# Patient Record
Sex: Female | Born: 1965 | Race: White | Hispanic: No | Marital: Married | Smoking: Never smoker
Health system: Southern US, Community
[De-identification: ages and names within clinical notes are randomized; demographics above are authoritative.]

---

## 2011-07-08 HISTORY — PX: MYOMECTOMY: SHX85

## 2018-09-28 ENCOUNTER — Other Ambulatory Visit: Payer: Self-pay | Admitting: Physician Assistant

## 2018-09-28 DIAGNOSIS — Z1231 Encounter for screening mammogram for malignant neoplasm of breast: Secondary | ICD-10-CM

## 2019-01-05 ENCOUNTER — Other Ambulatory Visit: Payer: Self-pay

## 2019-01-05 ENCOUNTER — Ambulatory Visit
Admission: RE | Admit: 2019-01-05 | Discharge: 2019-01-05 | Disposition: A | Discharge status: Home / Self Care | Payer: Managed Care, Other (non HMO) | Source: Ambulatory Visit | Attending: Physician Assistant | Admitting: Physician Assistant

## 2019-01-05 DIAGNOSIS — Z1231 Encounter for screening mammogram for malignant neoplasm of breast: Secondary | ICD-10-CM | POA: Diagnosis not present

## 2019-05-30 ENCOUNTER — Other Ambulatory Visit: Payer: Self-pay | Admitting: Sports Medicine

## 2019-05-30 DIAGNOSIS — M75122 Complete rotator cuff tear or rupture of left shoulder, not specified as traumatic: Secondary | ICD-10-CM

## 2019-05-30 DIAGNOSIS — G8929 Other chronic pain: Secondary | ICD-10-CM

## 2019-05-30 DIAGNOSIS — M75102 Unspecified rotator cuff tear or rupture of left shoulder, not specified as traumatic: Secondary | ICD-10-CM

## 2019-05-31 ENCOUNTER — Other Ambulatory Visit (HOSPITAL_COMMUNITY): Payer: Self-pay | Admitting: Sports Medicine

## 2019-05-31 DIAGNOSIS — M75102 Unspecified rotator cuff tear or rupture of left shoulder, not specified as traumatic: Secondary | ICD-10-CM

## 2019-05-31 DIAGNOSIS — G8929 Other chronic pain: Secondary | ICD-10-CM

## 2019-05-31 DIAGNOSIS — M25512 Pain in left shoulder: Secondary | ICD-10-CM

## 2019-05-31 DIAGNOSIS — M75122 Complete rotator cuff tear or rupture of left shoulder, not specified as traumatic: Secondary | ICD-10-CM

## 2019-06-24 ENCOUNTER — Other Ambulatory Visit
Admission: RE | Admit: 2019-06-24 | Discharge: 2019-06-24 | Disposition: A | Discharge status: Home / Self Care | Payer: Commercial Managed Care - PPO | Source: Ambulatory Visit | Attending: Sports Medicine | Admitting: Sports Medicine

## 2019-06-24 DIAGNOSIS — Z01812 Encounter for preprocedural laboratory examination: Secondary | ICD-10-CM | POA: Insufficient documentation

## 2019-06-24 DIAGNOSIS — U071 COVID-19: Secondary | ICD-10-CM | POA: Insufficient documentation

## 2019-06-25 ENCOUNTER — Ambulatory Visit: Admission: RE | Admit: 2019-06-25 | Payer: Commercial Managed Care - PPO | Source: Home / Self Care

## 2019-06-25 ENCOUNTER — Encounter: Admission: RE | Payer: Self-pay | Source: Home / Self Care

## 2019-06-25 LAB — SARS CORONAVIRUS 2 (TAT 6-24 HRS): SARS Coronavirus 2: POSITIVE — AB

## 2019-06-25 SURGERY — MRI WITH ANESTHESIA
Anesthesia: General | Laterality: Left

## 2019-06-25 NOTE — Progress Notes (Signed)
Planning to call patient to provide MRI instructions. Noted positive COVID result. Dr. Rudene Christians (on call provider for ordering practice) notified. Verbal order to cancel MRI given. I advised him that on reschedule that H&P would be out of date. MRI/ OR notified.

## 2019-06-28 ENCOUNTER — Ambulatory Visit (HOSPITAL_COMMUNITY): Payer: Commercial Managed Care - PPO

## 2019-06-28 ENCOUNTER — Encounter (HOSPITAL_COMMUNITY): Payer: Self-pay

## 2019-08-05 ENCOUNTER — Ambulatory Visit (HOSPITAL_COMMUNITY): Payer: Commercial Managed Care - PPO

## 2019-08-05 ENCOUNTER — Ambulatory Visit: Payer: Commercial Managed Care - PPO

## 2019-08-09 ENCOUNTER — Ambulatory Visit (HOSPITAL_COMMUNITY): Payer: Commercial Managed Care - PPO

## 2019-09-08 ENCOUNTER — Other Ambulatory Visit: Payer: Self-pay | Admitting: Obstetrics and Gynecology

## 2019-09-08 DIAGNOSIS — Z1231 Encounter for screening mammogram for malignant neoplasm of breast: Secondary | ICD-10-CM

## 2019-10-14 ENCOUNTER — Other Ambulatory Visit: Admission: RE | Admit: 2019-10-14 | Payer: Commercial Managed Care - PPO | Source: Ambulatory Visit

## 2019-10-14 ENCOUNTER — Other Ambulatory Visit
Admission: RE | Admit: 2019-10-14 | Discharge: 2019-10-14 | Disposition: A | Discharge status: Home / Self Care | Payer: Commercial Managed Care - PPO | Source: Ambulatory Visit | Attending: Sports Medicine | Admitting: Sports Medicine

## 2019-10-14 DIAGNOSIS — Z20822 Contact with and (suspected) exposure to covid-19: Secondary | ICD-10-CM | POA: Insufficient documentation

## 2019-10-14 DIAGNOSIS — Z01812 Encounter for preprocedural laboratory examination: Secondary | ICD-10-CM | POA: Diagnosis not present

## 2019-10-14 LAB — SARS CORONAVIRUS 2 (TAT 6-24 HRS): SARS Coronavirus 2: NEGATIVE

## 2019-10-17 ENCOUNTER — Other Ambulatory Visit: Payer: Self-pay

## 2019-10-17 ENCOUNTER — Encounter (HOSPITAL_COMMUNITY): Payer: Self-pay | Admitting: *Deleted

## 2019-10-17 NOTE — Progress Notes (Addendum)
Mrs Milani denies chest pain or shortness of breath. Patient tested negative for Covid 10/14/19- she has been at a home until today- she went to a couple of stores.  Patient reports that she wore a mask and distanced herself.Patient said that she didn't know that she was supposed to be in quarantine.  Mrs Writer reports that "she was Covid postive the end of December she thought it would be ok."  I informed Mrs Jasko that I will speak with the an anesthesiologist and if there are any hanges she would hear from the physician who ordered the MRI. I informed Dr Joanna Hews that patient wen to 2 stores with mask and distancing, he said that it is ok.

## 2019-10-18 ENCOUNTER — Ambulatory Visit: Admission: RE | Admit: 2019-10-18 | Payer: Commercial Managed Care - PPO | Source: Home / Self Care

## 2019-10-18 ENCOUNTER — Encounter (HOSPITAL_COMMUNITY)
Admission: RE | Disposition: A | Discharge status: Home / Self Care | Payer: Self-pay | Source: Home / Self Care | Attending: Sports Medicine

## 2019-10-18 ENCOUNTER — Encounter (HOSPITAL_COMMUNITY): Payer: Self-pay

## 2019-10-18 ENCOUNTER — Ambulatory Visit (HOSPITAL_COMMUNITY): Payer: Commercial Managed Care - PPO | Admitting: Anesthesiology

## 2019-10-18 ENCOUNTER — Other Ambulatory Visit: Payer: Self-pay

## 2019-10-18 ENCOUNTER — Ambulatory Visit (HOSPITAL_COMMUNITY): Payer: Commercial Managed Care - PPO

## 2019-10-18 ENCOUNTER — Ambulatory Visit (HOSPITAL_COMMUNITY)
Admission: RE | Admit: 2019-10-18 | Discharge: 2019-10-18 | Disposition: A | Discharge status: Home / Self Care | Payer: Commercial Managed Care - PPO | Attending: Sports Medicine | Admitting: Sports Medicine

## 2019-10-18 ENCOUNTER — Ambulatory Visit (HOSPITAL_COMMUNITY)
Admission: RE | Admit: 2019-10-18 | Discharge: 2019-10-18 | Disposition: A | Discharge status: Home / Self Care | Payer: Commercial Managed Care - PPO | Source: Ambulatory Visit | Attending: Sports Medicine | Admitting: Sports Medicine

## 2019-10-18 ENCOUNTER — Encounter: Admission: RE | Payer: Self-pay | Source: Home / Self Care

## 2019-10-18 DIAGNOSIS — G8929 Other chronic pain: Secondary | ICD-10-CM | POA: Diagnosis not present

## 2019-10-18 DIAGNOSIS — M19012 Primary osteoarthritis, left shoulder: Secondary | ICD-10-CM | POA: Diagnosis not present

## 2019-10-18 DIAGNOSIS — M25512 Pain in left shoulder: Secondary | ICD-10-CM

## 2019-10-18 DIAGNOSIS — M75122 Complete rotator cuff tear or rupture of left shoulder, not specified as traumatic: Secondary | ICD-10-CM | POA: Insufficient documentation

## 2019-10-18 DIAGNOSIS — M75102 Unspecified rotator cuff tear or rupture of left shoulder, not specified as traumatic: Secondary | ICD-10-CM

## 2019-10-18 HISTORY — PX: RADIOLOGY WITH ANESTHESIA: SHX6223

## 2019-10-18 LAB — POCT PREGNANCY, URINE: Preg Test, Ur: NEGATIVE

## 2019-10-18 SURGERY — MRI WITH ANESTHESIA
Anesthesia: General | Laterality: Left

## 2019-10-18 MED ORDER — LACTATED RINGERS IV SOLN: INTRAVENOUS | Status: DC

## 2019-10-18 MED FILL — Rocuronium Bromide IV Soln 50 MG/5ML (10 MG/ML): INTRAVENOUS | Qty: 5 | Status: AC

## 2019-10-18 MED FILL — Lidocaine HCl Local Soln Prefilled Syringe 100 MG/5ML (2%): INTRAMUSCULAR | Qty: 5 | Status: AC

## 2019-10-18 MED FILL — Fentanyl Citrate Preservative Free (PF) Inj 100 MCG/2ML: INTRAMUSCULAR | Qty: 2 | Status: AC

## 2019-10-18 MED FILL — Ondansetron HCl Inj 4 MG/2ML (2 MG/ML): INTRAMUSCULAR | Qty: 2 | Status: AC

## 2019-10-18 MED FILL — Propofol IV Emul 200 MG/20ML (10 MG/ML): INTRAVENOUS | Qty: 20 | Status: AC

## 2019-10-18 MED FILL — Midazolam HCl Inj 2 MG/2ML (Base Equivalent): INTRAMUSCULAR | Qty: 2 | Status: AC

## 2019-10-18 MED FILL — Sugammadex Sodium IV 200 MG/2ML (Base Equivalent): INTRAVENOUS | Qty: 2 | Status: AC

## 2019-10-18 MED FILL — Dexamethasone Sod Phosphate Preservative Free Inj 10 MG/ML: INTRAMUSCULAR | Qty: 1 | Status: AC

## 2019-10-18 MED FILL — Glycopyrrolate Inj 0.2 MG/ML: INTRAMUSCULAR | Qty: 1 | Status: AC

## 2019-10-18 MED FILL — Phenylephrine HCl IV Soln Pref Syr 0.4 MG/10ML (40 MCG/ML): INTRAVENOUS | Qty: 10 | Status: AC

## 2019-10-18 NOTE — Anesthesia Procedure Notes (Signed)
Procedure Name: Intubation Date/Time: 10/18/2019 11:38 AM Performed by: Mariea Clonts, CRNA Pre-anesthesia Checklist: Patient identified, Emergency Drugs available, Suction available and Patient being monitored Patient Re-evaluated:Patient Re-evaluated prior to induction Oxygen Delivery Method: Circle System Utilized Preoxygenation: Pre-oxygenation with 100% oxygen Induction Type: IV induction Ventilation: Mask ventilation without difficulty Laryngoscope Size: Miller and 2 Grade View: Grade II Tube type: Oral Tube size: 7.0 mm Number of attempts: 1 Airway Equipment and Method: Stylet and Oral airway Placement Confirmation: ETT inserted through vocal cords under direct vision,  positive ETCO2 and breath sounds checked- equal and bilateral Tube secured with: Tape Dental Injury: Teeth and Oropharynx as per pre-operative assessment

## 2019-10-18 NOTE — Transfer of Care (Signed)
Immediate Anesthesia Transfer of Care Note  Patient: Carla Rowland  Procedure(s) Performed: MRi left shoulder without contrast (Left )  Patient Location: PACU  Anesthesia Type:General  Level of Consciousness: awake, alert  and oriented  Airway & Oxygen Therapy: Patient Spontanous Breathing  Post-op Assessment: Report given to RN, Post -op Vital signs reviewed and stable and Patient moving all extremities X 4  Post vital signs: Reviewed and stable  Last Vitals:  Vitals Value Taken Time  BP 117/57 10/18/19 1216  Temp 36.7 C 10/18/19 1215  Pulse 59 10/18/19 1217  Resp 12 10/18/19 1217  SpO2 100 % 10/18/19 1217  Vitals shown include unvalidated device data.  Last Pain:  Vitals:   10/18/19 0846  TempSrc:   PainSc: 0-No pain         Complications: No apparent anesthesia complications

## 2019-10-18 NOTE — Anesthesia Preprocedure Evaluation (Signed)
Anesthesia Evaluation  Patient identified by MRN, date of birth, ID band Patient awake    Reviewed: Allergy & Precautions, H&P , NPO status , Patient's Chart, lab work & pertinent test results  Airway Mallampati: II   Neck ROM: full    Dental   Pulmonary neg pulmonary ROS,    breath sounds clear to auscultation       Cardiovascular negative cardio ROS   Rhythm:regular Rate:Normal     Neuro/Psych    GI/Hepatic   Endo/Other    Renal/GU      Musculoskeletal   Abdominal   Peds  Hematology   Anesthesia Other Findings   Reproductive/Obstetrics                             Anesthesia Physical Anesthesia Plan  ASA: I  Anesthesia Plan: General   Post-op Pain Management:    Induction: Intravenous  PONV Risk Score and Plan: 3 and Ondansetron, Dexamethasone, Midazolam and Treatment may vary due to age or medical condition  Airway Management Planned: Oral ETT  Additional Equipment:   Intra-op Plan:   Post-operative Plan: Extubation in OR  Informed Consent: I have reviewed the patients History and Physical, chart, labs and discussed the procedure including the risks, benefits and alternatives for the proposed anesthesia with the patient or authorized representative who has indicated his/her understanding and acceptance.       Plan Discussed with: CRNA, Anesthesiologist and Surgeon  Anesthesia Plan Comments:         Anesthesia Quick Evaluation

## 2019-10-19 NOTE — Anesthesia Postprocedure Evaluation (Signed)
Anesthesia Post Note  Patient: Carla Rowland  Procedure(s) Performed: MRi left shoulder without contrast (Left )     Patient location during evaluation: PACU Anesthesia Type: General Level of consciousness: awake and alert Pain management: pain level controlled Vital Signs Assessment: post-procedure vital signs reviewed and stable Respiratory status: spontaneous breathing, nonlabored ventilation, respiratory function stable and patient connected to nasal cannula oxygen Cardiovascular status: blood pressure returned to baseline and stable Postop Assessment: no apparent nausea or vomiting Anesthetic complications: no    Last Vitals:  Vitals:   10/18/19 1215 10/18/19 1226  BP:    Pulse:  (!) 55  Resp:  (!) 22  Temp: 36.7 C   SpO2: 96% 100%    Last Pain:  Vitals:   10/18/19 1226  TempSrc:   PainSc: [place]

## 2020-01-16 ENCOUNTER — Ambulatory Visit
Admission: RE | Admit: 2020-01-16 | Discharge: 2020-01-16 | Disposition: A | Discharge status: Home / Self Care | Payer: Commercial Managed Care - PPO | Source: Ambulatory Visit | Attending: Obstetrics and Gynecology | Admitting: Obstetrics and Gynecology

## 2020-01-16 DIAGNOSIS — Z1231 Encounter for screening mammogram for malignant neoplasm of breast: Secondary | ICD-10-CM

## 2020-09-19 ENCOUNTER — Other Ambulatory Visit: Payer: Self-pay | Admitting: Obstetrics and Gynecology

## 2020-09-19 DIAGNOSIS — Z1231 Encounter for screening mammogram for malignant neoplasm of breast: Secondary | ICD-10-CM

## 2020-09-21 NOTE — H&P (Signed)
Carla Rowland is a 55 y.o. female here forFractional D+C , H/S and novasure endometrial ablation   Has many c/o of irregular bleeding . Know multiple fibroid with largest 9 cm  embx 11 month ago with polyp , no hyperplasia  Past Medical History:  has a past medical history of Anemia.  Past Surgical History:  has a past surgical history that includes Myomectomy Abdominal (2013). Family History: family history includes No Known Problems in her brother, father, mother, and sister. Social History:  reports that she has never smoked. She has never used smokeless tobacco. She reports that she does not drink alcohol and does not use drugs. OB/GYN History:          OB History    Gravida  5   Para  4   Term      Preterm      AB  1   Living  3     SAB      IAB      Ectopic      Molar      Multiple      Live Births  3          Allergies: has No Known Allergies. Medications:  Current Outpatient Medications:    tranexamic acid (LYSTEDA) 650 mg tablet, Take 2 tablets (1,300 mg total) by mouth 3 (three) times daily Take for a maximum of 5 days during monthly menstruation., Disp: 30 tablet, Rfl: 3   Compound Medication, Take by mouth Blood builder   (Patient not taking: Reported on 09/19/2020  ), Disp: , Rfl:   Review of Systems: General:                      No fatigue or weight loss Eyes:                           No vision changes Ears:                            No hearing difficulty Respiratory:                No cough or shortness of breath Pulmonary:                  No asthma or shortness of breath Cardiovascular:           No chest pain, palpitations, dyspnea on exertion Gastrointestinal:          No abdominal bloating, chronic diarrhea, constipations, masses, pain or hematochezia Genitourinary:             No hematuria, dysuria, abnormal vaginal discharge, pelvic pain, Menometrorrhagia Lymphatic:                   No swollen lymph nodes Musculoskeletal:          No muscle weakness Neurologic:                  No extremity weakness, syncope, seizure disorder Psychiatric:                  No history of depression, delusions or suicidal/homicidal ideation    Exam:      Vitals:   09/19/20 1427  BP: 130/79  Pulse: 69    Body mass index is 19.94 kg/m.  WDWN white/ black female in NAD   Lungs: CTA  CV :  RRR without murmur   Breast: exam done in sitting and lying position : No dimpling or retraction, no dominant mass, no spontaneous discharge, no axillary adenopathy Neck:  no thyromegaly Abdomen: soft , no mass, normal active bowel sounds,  non-tender, no rebound tenderness Pelvic: tanner stage 5 ,  External genitalia: vulva /labia no lesions Urethra: no prolapse Vagina: normal physiologic d/c Cervix: no lesions, no cervical motion tenderness   Uterus: 14 weeks with irregular posterior fibroid  Adnexa: no mass,  non-tender   Rectovaginal: no mass heme negative Endometrial biopsy: The cervix was cleaned with betadine and a single tooth tenaculum is applied to the anterior cervix. The Pipelle catheter was placed into the endometrial cavity. It sounds to 6 cm and adequate tissue was removed.  Impression:   AUB  With fibroid utx     Plan:  FX D+C , H/S  And endometrial ablation . She is aware of the risks ( see Jefm Bryant notes) and she is aware that the the procedure may not be able to be completed due to the fibroids and distortion of the endometrial canal .

## 2020-09-24 ENCOUNTER — Other Ambulatory Visit: Payer: Self-pay | Admitting: Obstetrics and Gynecology

## 2020-09-25 ENCOUNTER — Encounter
Admission: RE | Admit: 2020-09-25 | Discharge: 2020-09-25 | Disposition: A | Discharge status: Home / Self Care | Payer: Commercial Managed Care - PPO | Source: Ambulatory Visit | Attending: Obstetrics and Gynecology | Admitting: Obstetrics and Gynecology

## 2020-09-25 ENCOUNTER — Other Ambulatory Visit: Payer: Self-pay

## 2020-09-25 NOTE — Patient Instructions (Signed)
Your procedure is scheduled on: Monday 10/01/20.  Report to THE FIRST FLOOR REGISTRATION DESK IN THE MEDICAL MALL ON THE MORNING OF SURGERY FIRST, THEN YOU WILL CHECK IN AT THE SURGERY INFORMATION DESK LOCATED OUTSIDE THE SAME DAY SURGERY DEPARTMENT LOCATED ON 2ND FLOOR MEDICAL MALL ENTRANCE.  To find out your arrival time please call [phone] between 1PM - 3PM on Friday 09/28/20.   Remember: Instructions that are not followed completely may result in serious medical risk, up to and including death, or upon the discretion of your surgeon and anesthesiologist your surgery may need to be rescheduled.     __X__ 1. Do not eat food after midnight the night before your procedure.                 No gum chewing or hard candies. You may drink clear liquids up to 2 hours                 before you are scheduled to arrive for your surgery- DO NOT drink clear                 liquids within 2 hours of the start of your surgery.                 Clear Liquids include:  water, apple juice without pulp, clear carbohydrate                 drink such as Clearfast or Gatorade, Black Coffee or Tea (Do not add                 milk or creamer to coffee or tea).  ** Dr. Ouida Sills would like for you to finish your Pre-Surgery Ensure on the morning of your surgery 2 hours prior to your scheduled arrival time.  __X__2.  On the morning of surgery brush your teeth with toothpaste and water, you may rinse your mouth with mouthwash if you wish.  Do not swallow any toothpaste or mouthwash.    __X__ 3.  No Alcohol for 24 hours before or after surgery.  __X__ 4.  Do Not Smoke or use e-cigarettes For 24 Hours Prior to Your Surgery.                 Do not use any chewable tobacco products for at least 6 hours prior to                 surgery.  __X__5.  Notify your doctor if there is any change in your medical condition      (cold, fever, infections).      Do NOT wear jewelry, make-up, hairpins, clips or nail  polish. Do NOT wear lotions, powders, or perfumes.  Do NOT shave 48 hours prior to surgery. Men may shave face and neck. Do NOT bring valuables to the hospital.     [facility] is not responsible for any belongings or valuables.   Contacts, dentures/partials or body piercings may not be worn into surgery. Bring a case for your contacts, glasses or hearing aids, a denture cup will be supplied.    Patients discharged the day of surgery will not be allowed to drive home.     __X__ Take these medicines the morning of surgery with A SIP OF WATER:     1. NONE     __X__ Use CHG Soap as directed.  __X__ Stop Blood Thinners Coumadin/Plavix/Xarelto/Pleta/Pradaxa/Eliquis/Effient/Aspirin   __X__ Stop Anti-inflammatories 7 days before surgery such  as Advil, Ibuprofen, Motrin, BC or Goodies Powder, Naprosyn, Naproxen, Aleve, Aspirin, Meloxicam. May take Tylenol if needed for pain or discomfort.   __X__Do not start taking any new herbal supplements or vitamins prior to your procedure.  __X__ Stop the following herbal supplements or vitamins:  BIOTIN PO  Blood Builder MegaFood   Wear comfortable clothing (specific to your surgery type) to the hospital.  Plan for stool softeners for home use; pain medications have a tendency to cause constipation. You can also help prevent constipation by eating foods high in fiber such as fruits and vegetables and drinking plenty of fluids as your diet allows.  After surgery, you can prevent lung complications by doing breathing exercises.Take deep breaths and cough every 1-2 hours. Your doctor may order a device called an Incentive Spirometer to help you take deep breaths.  Please call the [facility] Department at [phone] if you have any questions about these instructions.

## 2020-09-28 ENCOUNTER — Other Ambulatory Visit: Payer: Self-pay

## 2020-09-28 ENCOUNTER — Other Ambulatory Visit
Admission: RE | Admit: 2020-09-28 | Discharge: 2020-09-28 | Disposition: A | Discharge status: Home / Self Care | Payer: Commercial Managed Care - PPO | Source: Ambulatory Visit | Attending: Obstetrics and Gynecology | Admitting: Obstetrics and Gynecology

## 2020-09-28 DIAGNOSIS — Z20822 Contact with and (suspected) exposure to covid-19: Secondary | ICD-10-CM | POA: Diagnosis not present

## 2020-09-28 DIAGNOSIS — Z01812 Encounter for preprocedural laboratory examination: Secondary | ICD-10-CM | POA: Insufficient documentation

## 2020-09-28 LAB — BASIC METABOLIC PANEL
Anion gap: 7 (ref 5–15)
BUN: 8 mg/dL (ref 6–20)
CO2: 26 mmol/L (ref 22–32)
Calcium: 9.2 mg/dL (ref 8.9–10.3)
Chloride: 106 mmol/L (ref 98–111)
Creatinine, Ser: 0.74 mg/dL (ref 0.44–1.00)
GFR, Estimated: >60 mL/min (ref >60)
Glucose, Bld: 99 mg/dL (ref 70–99)
Potassium: 3.9 mmol/L (ref 3.5–5.1)
Sodium: 139 mmol/L (ref 135–145)

## 2020-09-28 LAB — TYPE AND SCREEN
ABO/RH(D): B POS
Antibody Screen: NEGATIVE

## 2020-09-28 LAB — CBC
HCT: 35.2 % — ABNORMAL LOW (ref 36.0–46.0)
Hemoglobin: 11.2 g/dL — ABNORMAL LOW (ref 12.0–15.0)
MCH: 29.9 pg (ref 26.0–34.0)
MCHC: 31.8 g/dL (ref 30.0–36.0)
MCV: 94.1 fL (ref 80.0–100.0)
Platelets: 274 10*3/uL (ref 150–400)
RBC: 3.74 MIL/uL — ABNORMAL LOW (ref 3.87–5.11)
RDW: 13.2 % (ref 11.5–15.5)
WBC: 4.5 10*3/uL (ref 4.0–10.5)
nRBC: 0 % (ref 0.0–0.2)

## 2020-09-29 LAB — SARS CORONAVIRUS 2 (TAT 6-24 HRS): SARS Coronavirus 2: NEGATIVE

## 2020-10-01 ENCOUNTER — Ambulatory Visit
Admission: RE | Admit: 2020-10-01 | Discharge: 2020-10-01 | Disposition: A | Discharge status: Home / Self Care | Payer: Commercial Managed Care - PPO | Attending: Obstetrics and Gynecology | Admitting: Obstetrics and Gynecology

## 2020-10-01 ENCOUNTER — Ambulatory Visit: Payer: Commercial Managed Care - PPO

## 2020-10-01 ENCOUNTER — Encounter: Payer: Self-pay | Admitting: Obstetrics and Gynecology

## 2020-10-01 ENCOUNTER — Other Ambulatory Visit: Payer: Self-pay

## 2020-10-01 ENCOUNTER — Encounter
Admission: RE | Disposition: A | Discharge status: Home / Self Care | Payer: Self-pay | Source: Home / Self Care | Attending: Obstetrics and Gynecology

## 2020-10-01 DIAGNOSIS — D259 Leiomyoma of uterus, unspecified: Secondary | ICD-10-CM | POA: Diagnosis not present

## 2020-10-01 DIAGNOSIS — N939 Abnormal uterine and vaginal bleeding, unspecified: Secondary | ICD-10-CM | POA: Insufficient documentation

## 2020-10-01 DIAGNOSIS — N840 Polyp of corpus uteri: Secondary | ICD-10-CM | POA: Diagnosis not present

## 2020-10-01 DIAGNOSIS — Z79899 Other long term (current) drug therapy: Secondary | ICD-10-CM | POA: Diagnosis not present

## 2020-10-01 HISTORY — PX: DILITATION & CURRETTAGE/HYSTROSCOPY WITH NOVASURE ABLATION: SHX5568

## 2020-10-01 LAB — POCT PREGNANCY, URINE: Preg Test, Ur: NEGATIVE

## 2020-10-01 SURGERY — DILATATION & CURETTAGE/HYSTEROSCOPY WITH NOVASURE ABLATION
Anesthesia: General

## 2020-10-01 MED ORDER — CEFAZOLIN SODIUM-DEXTROSE 2-4 GM/100ML-% IV SOLN
2.0000 g | Freq: Once | INTRAVENOUS | Status: AC
  Administered 2020-10-01: 2 g via INTRAVENOUS

## 2020-10-01 MED ORDER — PROPOFOL 10 MG/ML IV BOLUS
INTRAVENOUS | Status: AC
  Filled 2020-10-01: qty 20

## 2020-10-01 MED ORDER — GABAPENTIN 300 MG PO CAPS
ORAL_CAPSULE | ORAL | Status: AC
  Administered 2020-10-01: 300 mg via ORAL
  Filled 2020-10-01: qty 1

## 2020-10-01 MED ORDER — CHLORHEXIDINE GLUCONATE 0.12 % MT SOLN: 15.0000 mL | Freq: Once | OROMUCOSAL | Status: AC

## 2020-10-01 MED ORDER — FENTANYL CITRATE (PF) 100 MCG/2ML IJ SOLN
INTRAMUSCULAR | Status: DC | PRN
  Administered 2020-10-01: 25 ug via INTRAVENOUS

## 2020-10-01 MED ORDER — ONDANSETRON HCL 4 MG/2ML IJ SOLN
INTRAMUSCULAR | Status: DC | PRN
  Administered 2020-10-01: 4 mg via INTRAVENOUS

## 2020-10-01 MED ORDER — HYDROCODONE-ACETAMINOPHEN 5-325 MG PO TABS: 1.0000 | ORAL_TABLET | ORAL | Status: DC | PRN

## 2020-10-01 MED ORDER — PROPOFOL 500 MG/50ML IV EMUL
INTRAVENOUS | Status: DC | PRN
  Administered 2020-10-01: 125 ug/kg/min via INTRAVENOUS

## 2020-10-01 MED ORDER — EPHEDRINE SULFATE 50 MG/ML IJ SOLN
INTRAMUSCULAR | Status: DC | PRN
  Administered 2020-10-01: 5 mg via INTRAVENOUS

## 2020-10-01 MED ORDER — DEXAMETHASONE SODIUM PHOSPHATE 10 MG/ML IJ SOLN
INTRAMUSCULAR | Status: AC
  Filled 2020-10-01: qty 1

## 2020-10-01 MED ORDER — MIDAZOLAM HCL 2 MG/2ML IJ SOLN
INTRAMUSCULAR | Status: AC
  Filled 2020-10-01: qty 2

## 2020-10-01 MED ORDER — POVIDONE-IODINE 10 % EX SWAB: 2.0000 "application " | Freq: Once | CUTANEOUS | Status: DC

## 2020-10-01 MED ORDER — DEXAMETHASONE SODIUM PHOSPHATE 10 MG/ML IJ SOLN
INTRAMUSCULAR | Status: DC | PRN
  Administered 2020-10-01: 10 mg via INTRAVENOUS

## 2020-10-01 MED ORDER — FAMOTIDINE 20 MG PO TABS: 20.0000 mg | ORAL_TABLET | Freq: Once | ORAL | Status: AC

## 2020-10-01 MED ORDER — ONDANSETRON HCL 4 MG/2ML IJ SOLN
INTRAMUSCULAR | Status: AC
  Filled 2020-10-01: qty 2

## 2020-10-01 MED ORDER — ORAL CARE MOUTH RINSE: 15.0000 mL | Freq: Once | OROMUCOSAL | Status: AC

## 2020-10-01 MED ORDER — ACETAMINOPHEN 500 MG PO TABS
ORAL_TABLET | ORAL | Status: AC
  Administered 2020-10-01: 1000 mg via ORAL
  Filled 2020-10-01: qty 2

## 2020-10-01 MED ORDER — ACETAMINOPHEN 500 MG PO TABS: 1000.0000 mg | ORAL_TABLET | ORAL | Status: AC

## 2020-10-01 MED ORDER — ONDANSETRON 4 MG PO TBDP: 4.0000 mg | ORAL_TABLET | Freq: Four times a day (QID) | ORAL | Status: DC | PRN

## 2020-10-01 MED ORDER — KETOROLAC TROMETHAMINE 30 MG/ML IJ SOLN
INTRAMUSCULAR | Status: AC
  Filled 2020-10-01: qty 1

## 2020-10-01 MED ORDER — LIDOCAINE HCL (CARDIAC) PF 100 MG/5ML IV SOSY
PREFILLED_SYRINGE | INTRAVENOUS | Status: DC | PRN
  Administered 2020-10-01: 60 mg via INTRAVENOUS

## 2020-10-01 MED ORDER — FENTANYL CITRATE (PF) 100 MCG/2ML IJ SOLN
25.0000 ug | INTRAMUSCULAR | Status: DC | PRN
Start: 2020-10-01 — End: 2020-10-01

## 2020-10-01 MED ORDER — FAMOTIDINE 20 MG PO TABS
ORAL_TABLET | ORAL | Status: AC
  Administered 2020-10-01: 20 mg via ORAL
  Filled 2020-10-01: qty 1

## 2020-10-01 MED ORDER — MIDAZOLAM HCL 2 MG/2ML IJ SOLN
INTRAMUSCULAR | Status: DC | PRN
  Administered 2020-10-01: 2 mg via INTRAVENOUS

## 2020-10-01 MED ORDER — FENTANYL CITRATE (PF) 100 MCG/2ML IJ SOLN
INTRAMUSCULAR | Status: AC
  Filled 2020-10-01: qty 2

## 2020-10-01 MED ORDER — CHLORHEXIDINE GLUCONATE 0.12 % MT SOLN
OROMUCOSAL | Status: AC
  Administered 2020-10-01: 15 mL via OROMUCOSAL
  Filled 2020-10-01: qty 15

## 2020-10-01 MED ORDER — LACTATED RINGERS IV SOLN: INTRAVENOUS | Status: DC

## 2020-10-01 MED ORDER — PROPOFOL 10 MG/ML IV BOLUS
INTRAVENOUS | Status: DC | PRN
  Administered 2020-10-01: 40 mg via INTRAVENOUS
  Administered 2020-10-01: 130 mg via INTRAVENOUS
  Administered 2020-10-01: 30 mg via INTRAVENOUS

## 2020-10-01 MED ORDER — CEFAZOLIN SODIUM-DEXTROSE 2-4 GM/100ML-% IV SOLN
INTRAVENOUS | Status: AC
  Filled 2020-10-01: qty 100

## 2020-10-01 MED ORDER — LIDOCAINE HCL (PF) 2 % IJ SOLN
INTRAMUSCULAR | Status: AC
  Filled 2020-10-01: qty 5

## 2020-10-01 MED ORDER — GABAPENTIN 300 MG PO CAPS: 300.0000 mg | ORAL_CAPSULE | ORAL | Status: AC

## 2020-10-01 MED ORDER — PROPOFOL 500 MG/50ML IV EMUL
INTRAVENOUS | Status: AC
  Filled 2020-10-01: qty 50

## 2020-10-01 MED ORDER — KETOROLAC TROMETHAMINE 30 MG/ML IJ SOLN
INTRAMUSCULAR | Status: DC | PRN
  Administered 2020-10-01: 30 mg via INTRAVENOUS

## 2020-10-01 MED ORDER — ONDANSETRON HCL 4 MG/2ML IJ SOLN: 4.0000 mg | Freq: Once | INTRAMUSCULAR | Status: DC | PRN

## 2020-10-01 SURGICAL SUPPLY — 24 items
BAG INFUSER PRESSURE 100CC (MISCELLANEOUS) ×1 IMPLANT
CANISTER SUCT 3000ML PPV (MISCELLANEOUS) ×2 IMPLANT
CATH ROBINSON RED A/P 16FR (CATHETERS) IMPLANT
COVER WAND RF STERILE (DRAPES) IMPLANT
DEVICE MYOSURE LITE (MISCELLANEOUS) IMPLANT
DEVICE MYOSURE REACH (MISCELLANEOUS) IMPLANT
GLOVE SURG SYN 8.0 (GLOVE) ×2 IMPLANT
GLOVE SURG SYN 8.0 PF PI (GLOVE) ×1 IMPLANT
GOWN STRL REUS W/ TWL LRG LVL3 (GOWN DISPOSABLE) ×1 IMPLANT
GOWN STRL REUS W/ TWL XL LVL3 (GOWN DISPOSABLE) ×1 IMPLANT
GOWN STRL REUS W/TWL LRG LVL3 (GOWN DISPOSABLE) ×2
GOWN STRL REUS W/TWL XL LVL3 (GOWN DISPOSABLE) ×2
IV NS 1000ML (IV SOLUTION) ×2
IV NS 1000ML BAXH (IV SOLUTION) ×1 IMPLANT
IV NS IRRIG 3000ML ARTHROMATIC (IV SOLUTION) ×2 IMPLANT
KIT PROCEDURE FLUENT (KITS) ×2 IMPLANT
KIT TURNOVER CYSTO (KITS) ×2 IMPLANT
MANIFOLD NEPTUNE II (INSTRUMENTS) ×2 IMPLANT
PACK DNC HYST (MISCELLANEOUS) IMPLANT
PAD OB MATERNITY 4.3X12.25 (PERSONAL CARE ITEMS) ×2 IMPLANT
PAD PREP 24X41 OB/GYN DISP (PERSONAL CARE ITEMS) ×2 IMPLANT
SEAL ROD LENS SCOPE MYOSURE (ABLATOR) ×2 IMPLANT
TOWEL OR 17X26 4PK STRL BLUE (TOWEL DISPOSABLE) ×2 IMPLANT
TUBING CONNECTING 10 (TUBING) IMPLANT

## 2020-10-01 NOTE — Brief Op Note (Signed)
10/01/2020  10:34 AM  PATIENT:  Carla Rowland  55 y.o. female  PRE-OPERATIVE DIAGNOSIS:  Fibroids, Mnorrhagia  POST-OPERATIVE DIAGNOSIS:  Fibroids, Mnorrhagia  PROCEDURE:  Fractional D+C , hysteroscopy   SURGEON:  Surgeon(s) and Role:    * Quiera Diffee, Gwen Her, MD - Primary  PHYSICIAN ASSISTANT:   ASSISTANTS: none   ANESTHESIA:   LMA  EBL:  5 mL IOF 700 cc OU 100 cc   BLOOD ADMINISTERED:none  DRAINS: none   LOCAL MEDICATIONS USED:  NONE  SPECIMEN:  Source of Specimen:  ecc, endometrial curedttings   DISPOSITION OF SPECIMEN:  PATHOLOGY  COUNTS:  YES  TOURNIQUET:  * No tourniquets in log *  DICTATION: .Other Dictation: Dictation Number verbal  PLAN OF CARE: Discharge to home after PACU  PATIENT DISPOSITION:  PACU - hemodynamically stable.   Delay start of Pharmacological VTE agent (>24hrs) due to surgical blood loss or risk of bleeding: not applicable

## 2020-10-01 NOTE — Progress Notes (Signed)
Updates H+P . Pt does not want the Novasure ablation . She is scheduled for fractional D+C , hysteroscopy , possible  Myosure resection of polyp if seen . Labs reviewed . All questions answered . Proceed .

## 2020-10-01 NOTE — Transfer of Care (Signed)
Immediate Anesthesia Transfer of Care Note  Patient: Carla Rowland  Procedure(s) Performed: FRACTIONAL DILATATION & CURETTAGE/HYSTEROSCOPY POLYP RESECTION. Possible Myosure (N/A )  Patient Location: PACU  Anesthesia Type:General  Level of Consciousness: drowsy and patient cooperative  Airway & Oxygen Therapy: Patient Spontanous Breathing and Patient connected to face mask oxygen  Post-op Assessment: Report given to RN, Post -op Vital signs reviewed and stable and Patient moving all extremities  Post vital signs: Reviewed and stable  Last Vitals:  Vitals Value Taken Time  BP 118/69 10/01/20 1035  Temp    Pulse 57 10/01/20 1039  Resp 12 10/01/20 1039  SpO2 100 % 10/01/20 1039  Vitals shown include unvalidated device data.  Last Pain:  Vitals:   10/01/20 0901  TempSrc: Temporal  PainSc: 0-No pain         Complications: No complications documented.

## 2020-10-01 NOTE — Anesthesia Procedure Notes (Signed)
Procedure Name: LMA Insertion Date/Time: 10/01/2020 9:43 AM Performed by: Debe Coder, CRNA Pre-anesthesia Checklist: Patient identified, Emergency Drugs available, Suction available, Patient being monitored and Timeout performed Patient Re-evaluated:Patient Re-evaluated prior to induction Oxygen Delivery Method: Circle system utilized Preoxygenation: Pre-oxygenation with 100% oxygen Induction Type: IV induction Ventilation: Mask ventilation without difficulty LMA: LMA inserted LMA Size: 4.0 Number of attempts: 1 Placement Confirmation: positive ETCO2 and breath sounds checked- equal and bilateral Tube secured with: Tape Dental Injury: Teeth and Oropharynx as per pre-operative assessment

## 2020-10-01 NOTE — Discharge Instructions (Signed)

## 2020-10-01 NOTE — Anesthesia Preprocedure Evaluation (Signed)
Anesthesia Evaluation  Patient identified by MRN, date of birth, ID band Patient awake    Reviewed: Allergy & Precautions, H&P , NPO status , Patient's Chart, lab work & pertinent test results, reviewed documented beta blocker date and time   Airway Mallampati: II  TM Distance: >3 FB Neck ROM: full    Dental  (+) Teeth Intact   Pulmonary neg pulmonary ROS,    Pulmonary exam normal        Cardiovascular Exercise Tolerance: Good negative cardio ROS Normal cardiovascular exam Rate:Normal     Neuro/Psych negative neurological ROS  negative psych ROS   GI/Hepatic negative GI ROS, Neg liver ROS,   Endo/Other  negative endocrine ROS  Renal/GU negative Renal ROS  negative genitourinary   Musculoskeletal   Abdominal   Peds  Hematology negative hematology ROS (+)   Anesthesia Other Findings   Reproductive/Obstetrics negative OB ROS                             Anesthesia Physical Anesthesia Plan  ASA: II  Anesthesia Plan: General LMA   Post-op Pain Management:    Induction:   PONV Risk Score and Plan:   Airway Management Planned:   Additional Equipment:   Intra-op Plan:   Post-operative Plan:   Informed Consent: I have reviewed the patients History and Physical, chart, labs and discussed the procedure including the risks, benefits and alternatives for the proposed anesthesia with the patient or authorized representative who has indicated his/her understanding and acceptance.       Plan Discussed with: CRNA  Anesthesia Plan Comments:         Anesthesia Quick Evaluation

## 2020-10-01 NOTE — Op Note (Signed)
Carla Rowland, INGRAM MEDICAL RECORD NO: [id number] ACCOUNT NO: [id number] DATE OF BIRTH: 1966/05/14 FACILITY: [facility] LOCATION: [facility] PHYSICIAN: Boykin Nearing, MD  Operative Report   DATE OF PROCEDURE: 10/01/2020  PREOPERATIVE DIAGNOSES: 1.  Abnormal uterine bleeding. 2.  Fibroid uterus. 3. Endometrial polyp  POSTOPERATIVE DIAGNOSES: 1.  Abnormal uterine bleeding. 2.  Fibroid uterus. 3. No endometrial polyp or submucosal fibroid seen on hysteroscopy   PROCEDURES: 1.  Fractional dilation and curettage. 2.  Hysteroscopy.  SURGEON:  Boykin Nearing, MD  ANESTHESIA:  LMA.  INDICATIONS:  A 55 year old, gravida 5, para 4, patient with a long history of abnormal uterine bleeding and known to have multiple fibroids in the uterus.  The patient has declined definitive therapy and declined endometrial ablation and wanted to  pursue fractional D and C and  resection of endometrial polyp as seen on office u/s .   DESCRIPTION OF PROCEDURE:  After LMA, the patient was placed in dorsal supine position with legs in the candy cane stirrups.  The patient's abdomen, perineum, and vagina were prepped and draped in normal sterile fashion.  Sterile drape placed.  Timeout  was performed.  Straight catheterization of the bladder yielded 100 mL clear urine.  A weighted speculum was placed in the posterior vaginal vault, and the anterior cervix was grasped with a single tooth tenaculum.  Examination under anesthesia revealed  14-week uterus with a large prominent fibroid posteriorly. Extremely retroverted uterus with the cervix tucked under the symphysis. The anterior cervix was grasped with a single tooth tenaculum, and endocervical curettage was performed.  Uterus was then sounded to #12 Hanks dilator, and the uterine sound was then placed with  tortuosity in a retroverted position.  Uterus sounded to 12 cm.  Cervix was then dilated to #16 Hanks dilator, and the hysteroscope was advanced  into the endometrial cavity without difficulty.  There was no evidence of prominent submucosal fibroids  nor endometrial polyps.  Pictures were taken, and hysteroscope was removed.  Normal saline was used as the distending medium.  Endocervix was then dilated to #20 Hanks dilator, and a sharp curettage was then performed of the endometrium.  Adequate  tissue was removed.  The procedure was terminated.  Good hemostasis was noted.  There were no complications.  ESTIMATED BLOOD LOSS:  5 mL.  INTRAOPERATIVE FLUIDS:  700 mL.  NET DEFICIT:  Normal saline with the hysteroscope 140 mL.  URINE OUTPUT:  100 mL.  The patient was taken to recovery room in good condition.   [facility] D: 10/01/2020 11:11:41 am T: 10/01/2020 11:29:00 am  JOB: [id number]/ [id number]

## 2020-10-01 NOTE — Anesthesia Postprocedure Evaluation (Signed)
Anesthesia Post Note  Patient: Carla Rowland  Procedure(s) Performed: FRACTIONAL DILATATION & CURETTAGE/HYSTEROSCOPY (N/A )  Patient location during evaluation: PACU Anesthesia Type: General Level of consciousness: awake and alert Pain management: pain level controlled Vital Signs Assessment: post-procedure vital signs reviewed and stable Respiratory status: spontaneous breathing, nonlabored ventilation, respiratory function stable and patient connected to nasal cannula oxygen Cardiovascular status: blood pressure returned to baseline and stable Postop Assessment: no apparent nausea or vomiting Anesthetic complications: no   No complications documented.   Last Vitals:  Vitals:   10/01/20 1115 10/01/20 1135  BP: 110/64 120/67  Pulse: (!) 49 (!) 53  Resp: 15 18  Temp: 37.2 C 36.6 C  SpO2: 100% 97%    Last Pain:  Vitals:   10/01/20 1135  TempSrc: Temporal  PainSc: 0-No pain                 Molli Barrows

## 2020-10-02 ENCOUNTER — Encounter: Payer: Self-pay | Admitting: Obstetrics and Gynecology

## 2020-10-02 LAB — SURGICAL PATHOLOGY

## 2021-01-17 ENCOUNTER — Other Ambulatory Visit: Payer: Self-pay

## 2021-01-17 ENCOUNTER — Ambulatory Visit
Admission: RE | Admit: 2021-01-17 | Discharge: 2021-01-17 | Disposition: A | Discharge status: Home / Self Care | Payer: Commercial Managed Care - PPO | Source: Ambulatory Visit | Attending: Obstetrics and Gynecology | Admitting: Obstetrics and Gynecology

## 2021-01-17 DIAGNOSIS — Z1231 Encounter for screening mammogram for malignant neoplasm of breast: Secondary | ICD-10-CM

## 2021-01-23 ENCOUNTER — Other Ambulatory Visit: Payer: Self-pay | Admitting: Obstetrics and Gynecology

## 2021-01-23 DIAGNOSIS — R928 Other abnormal and inconclusive findings on diagnostic imaging of breast: Secondary | ICD-10-CM

## 2021-01-23 DIAGNOSIS — N6310 Unspecified lump in the right breast, unspecified quadrant: Secondary | ICD-10-CM

## 2021-01-28 ENCOUNTER — Ambulatory Visit
Admission: RE | Admit: 2021-01-28 | Discharge: 2021-01-28 | Disposition: A | Discharge status: Home / Self Care | Payer: Commercial Managed Care - PPO | Source: Ambulatory Visit | Attending: Obstetrics and Gynecology | Admitting: Obstetrics and Gynecology

## 2021-01-28 ENCOUNTER — Other Ambulatory Visit: Payer: Self-pay

## 2021-01-28 DIAGNOSIS — N6310 Unspecified lump in the right breast, unspecified quadrant: Secondary | ICD-10-CM

## 2021-01-28 DIAGNOSIS — R928 Other abnormal and inconclusive findings on diagnostic imaging of breast: Secondary | ICD-10-CM

## 2022-02-28 ENCOUNTER — Other Ambulatory Visit: Payer: Self-pay | Admitting: Internal Medicine

## 2022-02-28 DIAGNOSIS — Z1231 Encounter for screening mammogram for malignant neoplasm of breast: Secondary | ICD-10-CM

## 2022-03-20 ENCOUNTER — Ambulatory Visit
Admission: RE | Admit: 2022-03-20 | Discharge: 2022-03-20 | Disposition: A | Discharge status: Home / Self Care | Payer: Managed Care, Other (non HMO) | Source: Ambulatory Visit | Attending: Internal Medicine | Admitting: Internal Medicine

## 2022-03-20 DIAGNOSIS — Z1231 Encounter for screening mammogram for malignant neoplasm of breast: Secondary | ICD-10-CM | POA: Diagnosis present

## 2022-06-24 ENCOUNTER — Ambulatory Visit: Payer: Managed Care, Other (non HMO) | Admitting: Obstetrics & Gynecology

## 2022-06-24 ENCOUNTER — Encounter: Payer: Self-pay | Admitting: Obstetrics & Gynecology

## 2022-06-24 ENCOUNTER — Other Ambulatory Visit (HOSPITAL_COMMUNITY)
Admission: RE | Admit: 2022-06-24 | Discharge: 2022-06-24 | Disposition: A | Discharge status: Home / Self Care | Payer: Managed Care, Other (non HMO) | Source: Ambulatory Visit | Attending: Obstetrics & Gynecology | Admitting: Obstetrics & Gynecology

## 2022-06-24 VITALS — BP 120/80 | Ht 69.0 in | Wt 137.0 lb

## 2022-06-24 DIAGNOSIS — Z124 Encounter for screening for malignant neoplasm of cervix: Secondary | ICD-10-CM

## 2022-06-24 DIAGNOSIS — Z01411 Encounter for gynecological examination (general) (routine) with abnormal findings: Secondary | ICD-10-CM

## 2022-06-24 DIAGNOSIS — Z01419 Encounter for gynecological examination (general) (routine) without abnormal findings: Secondary | ICD-10-CM | POA: Diagnosis present

## 2022-06-24 DIAGNOSIS — D219 Benign neoplasm of connective and other soft tissue, unspecified: Secondary | ICD-10-CM | POA: Diagnosis not present

## 2022-06-24 NOTE — Progress Notes (Signed)
Subjective:    Carla Rowland is a 56 y.o. married P3 who presents for an annual exam as a new patient. The patient has no complaints today. She had her LNP 01/2022. She has a h/o fibroids. She now has hot flashes but doesn't want medicine treatment. The patient is sexually active. GYN screening history: last pap: was normal. She denies a h/o abnormal paps. She understands ACOG's rec of pap every 5 years but would like them done annually. The patient wears seatbelts: yes. The patient participates in regular exercise: yes. Has the patient ever been transfused or tattooed?: not asked. The patient reports that there is not domestic violence in her life.   Menstrual History: OB History     Gravida  4   Para  3   Term  3   Preterm      AB  1   Living  3      SAB  1   IAB      Ectopic      Multiple      Live Births              No LMP recorded. Patient is perimenopausal.    The following portions of the patient's history were reviewed and updated as appropriate: allergies, current medications, past family history, past medical history, past social history, past surgical history, and problem list.  Review of Systems Pertinent items are noted in HPI.  Mammogram UTD Colonoscopy done at 56 yo (didn't followup in 3 years as rec'd). I re affirmed that she should contact her GI.   Objective:    BP 120/80   Ht '5\' 9"'$  (1.753 m)   Wt 137 lb (62.1 kg)   BMI 20.23 kg/m   General Appearance:    Alert, cooperative, no distress, appears stated age  Head:    Normocephalic, without obvious abnormality, atraumatic  Eyes:    PERRL, conjunctiva/corneas clear, EOM's intact, fundi    benign, both eyes  Ears:    Normal TM's and external ear canals, both ears  Nose:   Nares normal, septum midline, mucosa normal, no drainage    or sinus tenderness  Throat:   Lips, mucosa, and tongue normal; teeth and gums normal  Neck:   Supple, symmetrical, trachea midline, no adenopathy;    thyroid:  no  enlargement/tenderness/nodules; no carotid   bruit or JVD  Back:     Symmetric, no curvature, ROM normal, no CVA tenderness  Lungs:     Clear to auscultation bilaterally, respirations unlabored  Chest Wall:    No tenderness or deformity   Heart:    Regular rate and rhythm, S1 and S2 normal, no murmur, rub   or gallop  Breast Exam:    No tenderness, masses, or nipple abnormality  Abdomen:     Soft, non-tender, bowel sounds active all four quadrants,    no masses, no organomegaly  Genitalia:    Normal female without lesion, discharge or tenderness, 10 week size and shape, retrpverted uterus, non-palpable adnexae, no masses or tenderness     Extremities:   Extremities normal, atraumatic, no cyanosis or edema  Pulses:   2+ and symmetric all extremities  Skin:   Skin color, texture, turgor normal, no rashes or lesions  Lymph nodes:   Cervical, supraclavicular, and axillary nodes normal  Neurologic:   CNII-XII intact, normal strength, sensation and reflexes    throughout  .    Assessment:    Healthy female exam.  fibroids  Plan:     Thin prep Pap smear.with HR HPV cotesting per patient request Rec annual exam to follow fibroids

## 2022-06-26 LAB — CYTOLOGY - PAP
Comment: NEGATIVE
Diagnosis: NEGATIVE
High risk HPV: NEGATIVE

## 2022-10-22 IMAGING — MG MM DIGITAL DIAGNOSTIC UNILAT*R* W/ TOMO W/ CAD
4 series · 4 of 12 positions shown · non-contrast
Comparison: Previous exams including recent screening mammogram
dated 01/17/2021.

CLINICAL DATA: Patient returns today to evaluate a possible RIGHT
breast mass questioned on recent screening mammogram.

EXAM:
DIGITAL DIAGNOSTIC UNILATERAL RIGHT MAMMOGRAM WITH TOMOSYNTHESIS AND
CAD; ULTRASOUND RIGHT BREAST LIMITED
TECHNIQUE: Right digital diagnostic mammography and breast tomosynthesis was
performed. The images were evaluated with computer-aided detection.;
Targeted ultrasound examination of the right breast was performed

[R MLO synth-2D]
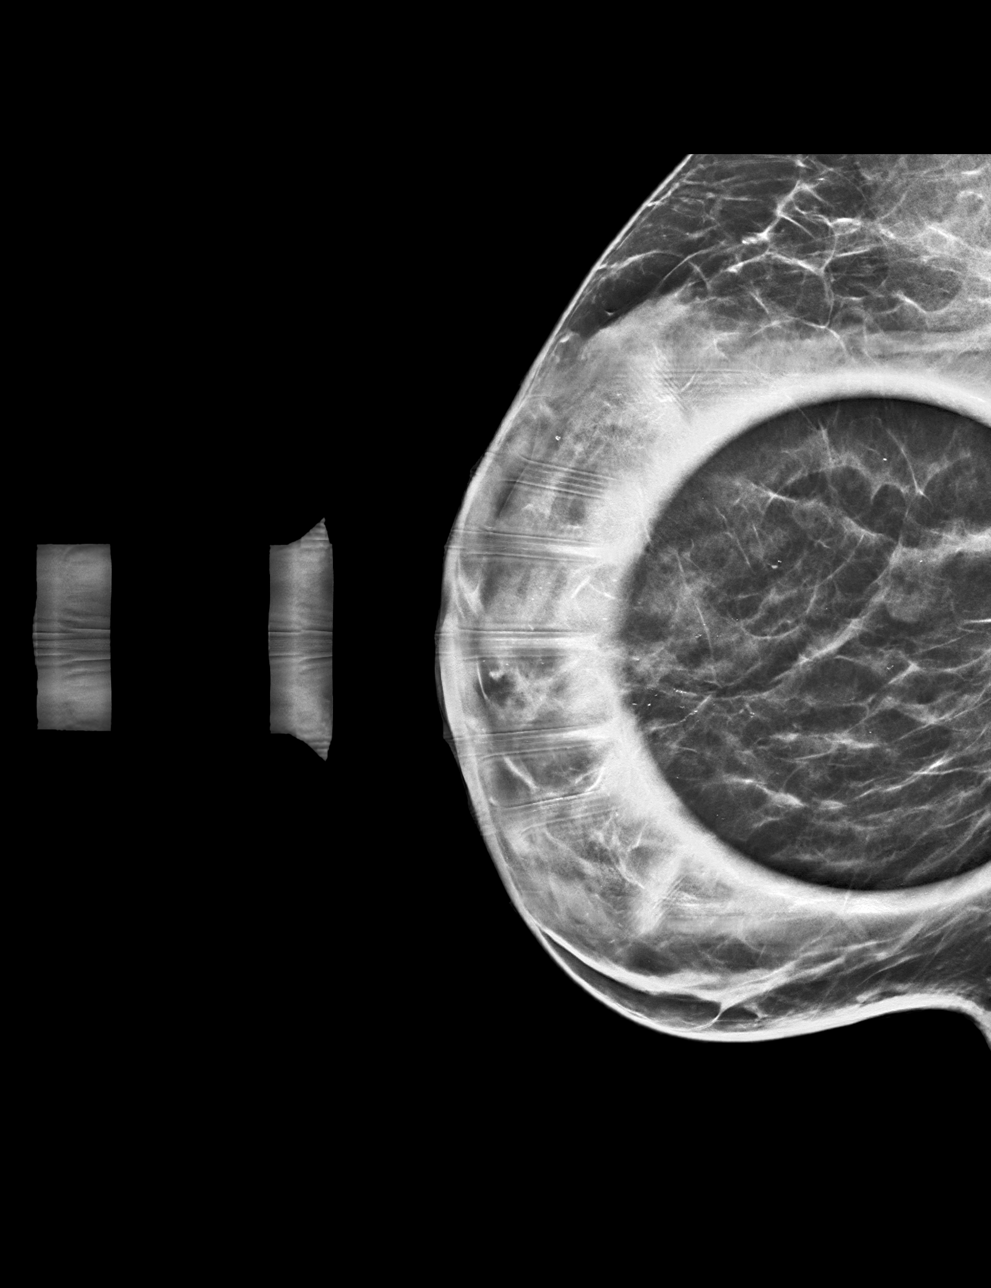

[R CC synth-2D]
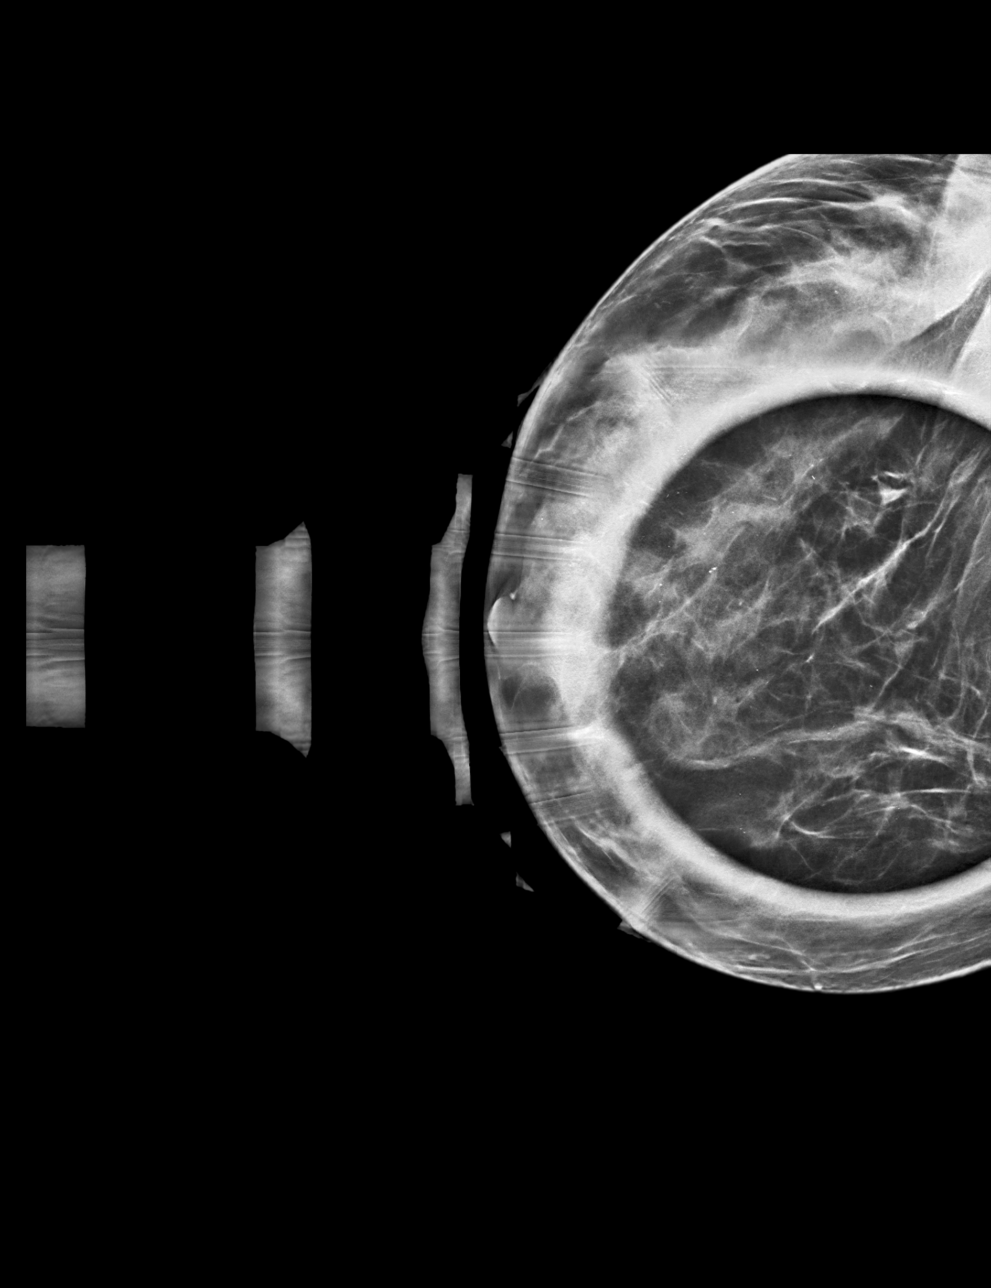

[R CC tomo · tomo slice 26/51.0]
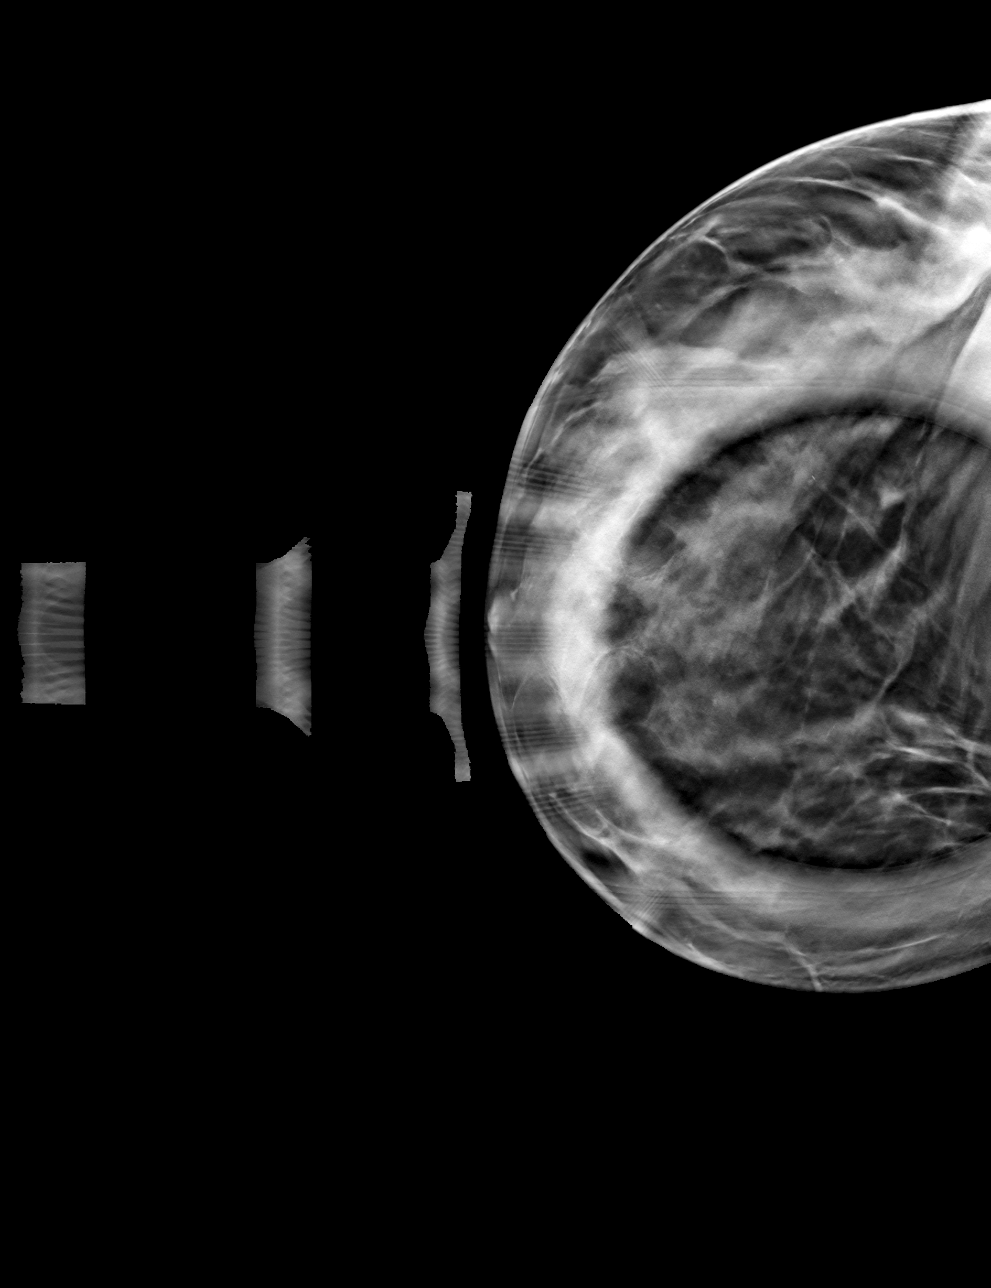

[R MLO tomo · tomo slice 22/43.0]
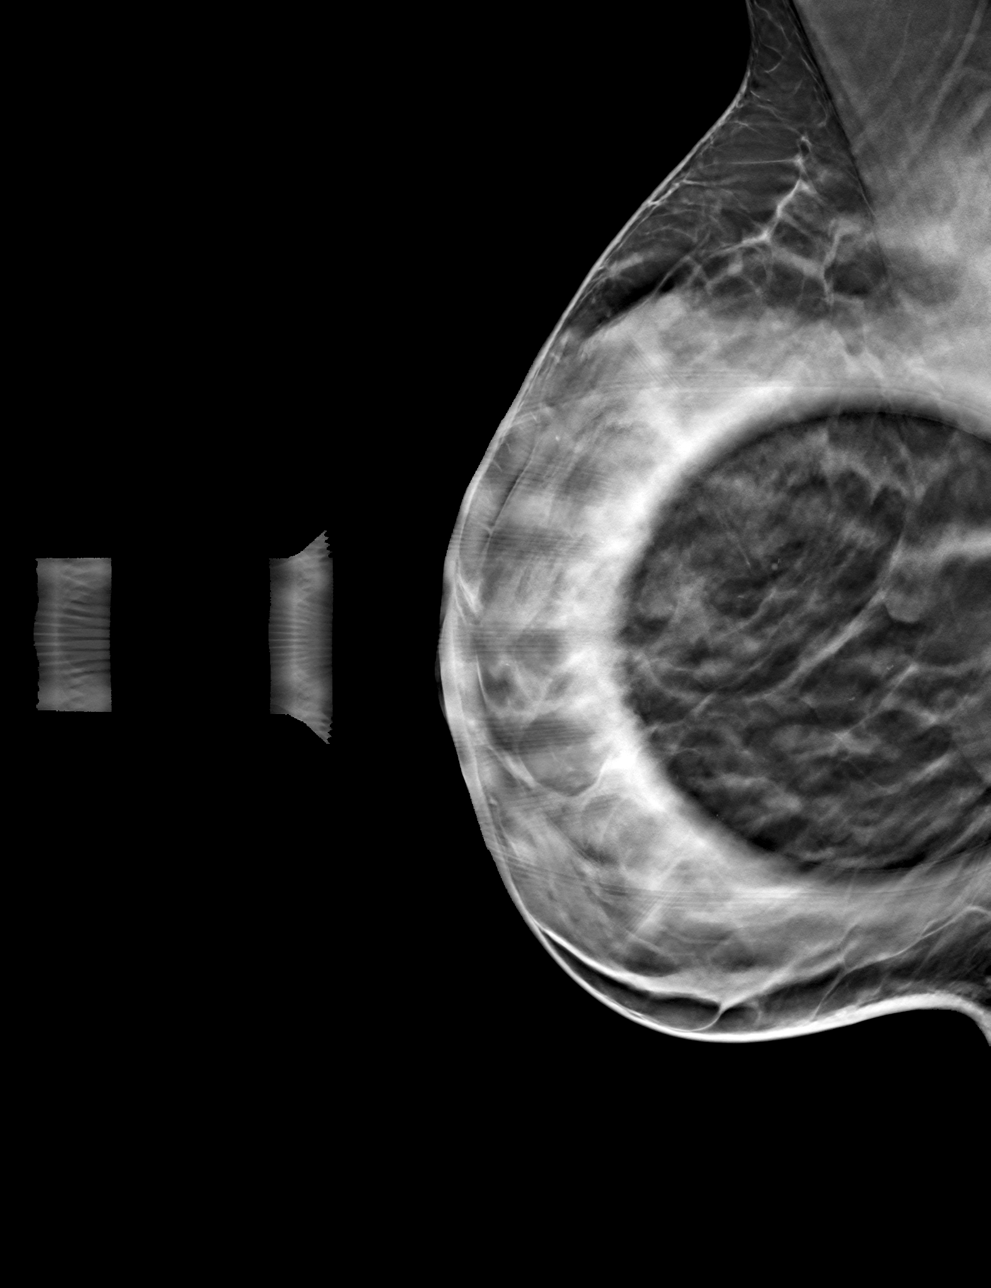

[4 of 12 positions shown; findings below may reference images not displayed]

ACR Breast Density Category c: The breast tissue is heterogeneously
dense, which may obscure small masses.
FINDINGS: RIGHT breast diagnostic mammogram: On today's additional diagnostic
views, with spot compression and 3D tomosynthesis, there is a
partially obscured mass confirmed within the posterior RIGHT breast,
measuring approximately 7 mm greatest dimension.

Targeted ultrasound is performed, showing a benign cyst in the RIGHT
breast at the 2 o'clock axis, 10 cm from the nipple, measuring 6 mm.
There is an additional benign cyst within the RIGHT breast at the 10
o'clock axis, 3 cm from the nipple, measuring 7 mm. One of these
cysts corresponds to the mammographic finding and the other cyst is
an incidental finding.
IMPRESSION: No evidence of malignancy. Benign cysts in the RIGHT breast, largest
measuring 7 mm, 1 of which corresponds to the mammographic finding.

Patient may return to routine annual bilateral screening mammogram
schedule.

RECOMMENDATION:
Screening mammogram in one year.(Code:[id number])

I have discussed the findings and recommendations with the patient.
If applicable, a reminder letter will be sent to the patient
regarding the next appointment.

BI-RADS CATEGORY  2: Benign.

## 2022-10-22 IMAGING — US US BREAST*R* LIMITED INC AXILLA
1 series · 10 of 10 positions shown · non-contrast
Comparison: Previous exams including recent screening mammogram
dated 01/17/2021.

CLINICAL DATA: Patient returns today to evaluate a possible RIGHT
breast mass questioned on recent screening mammogram.

EXAM:
DIGITAL DIAGNOSTIC UNILATERAL RIGHT MAMMOGRAM WITH TOMOSYNTHESIS AND
CAD; ULTRASOUND RIGHT BREAST LIMITED
TECHNIQUE: Right digital diagnostic mammography and breast tomosynthesis was
performed. The images were evaluated with computer-aided detection.;
Targeted ultrasound examination of the right breast was performed

[Series 1: us breast*right* limited inc axilla · 0.04mm/px · 10 of 10 slices shown]
[im 1/10]
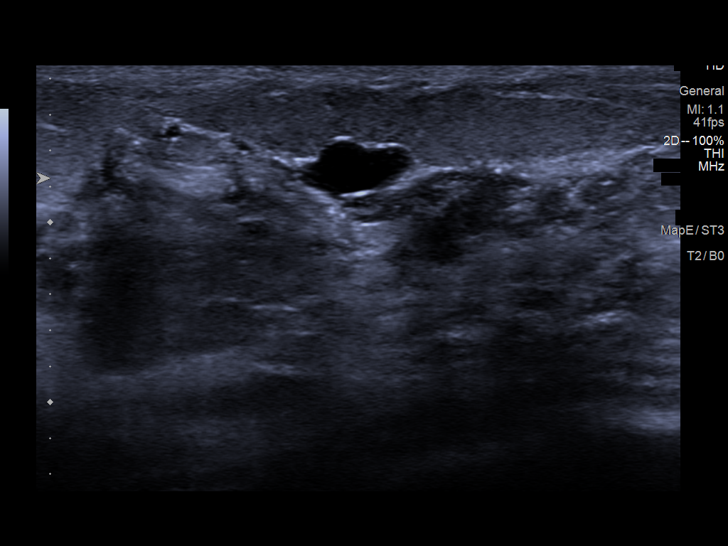
[im 2/10]
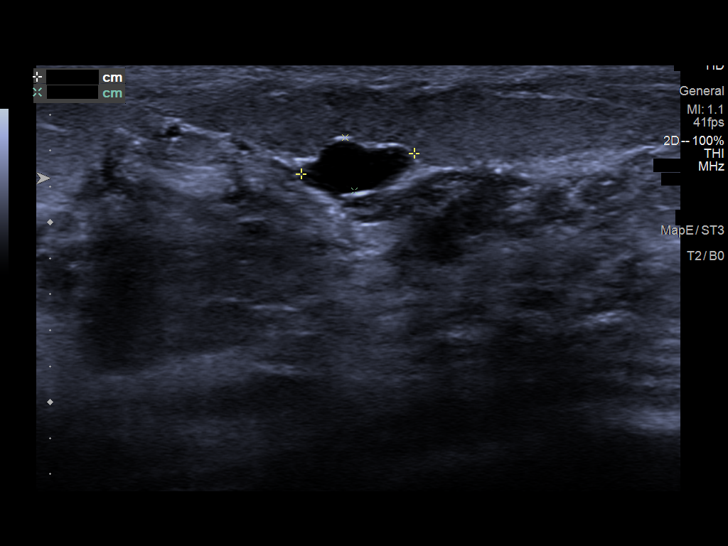
[im 3/10]
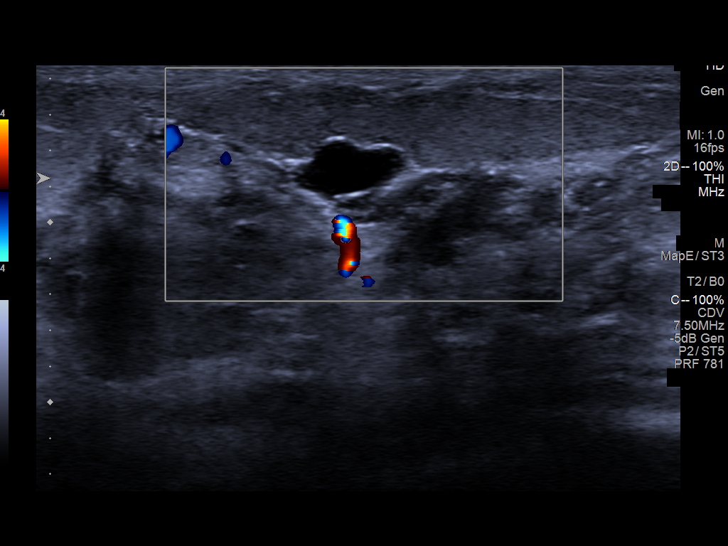
[im 4/10]
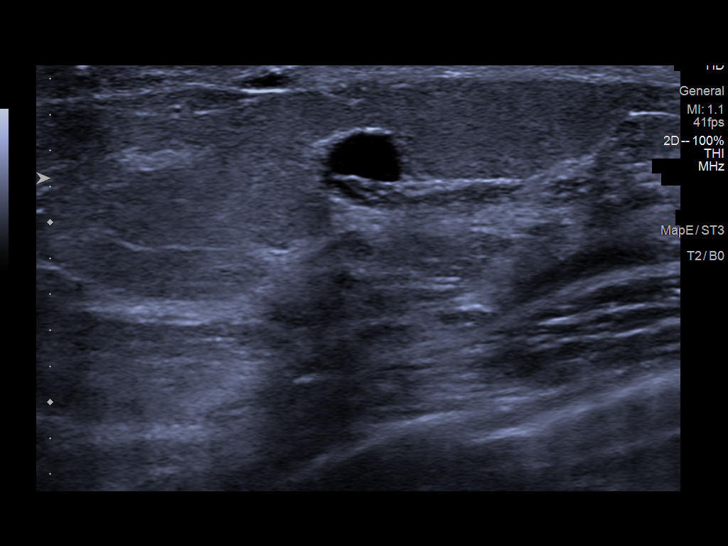
[im 5/10]
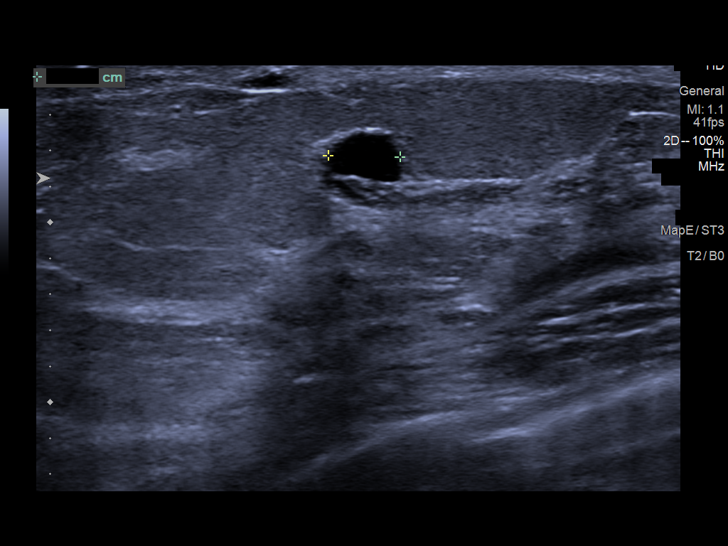
[im 6/10]
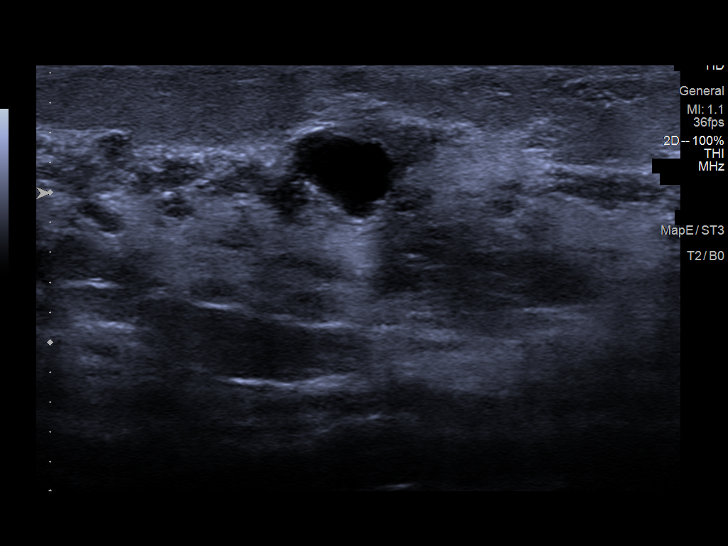
[im 7/10]
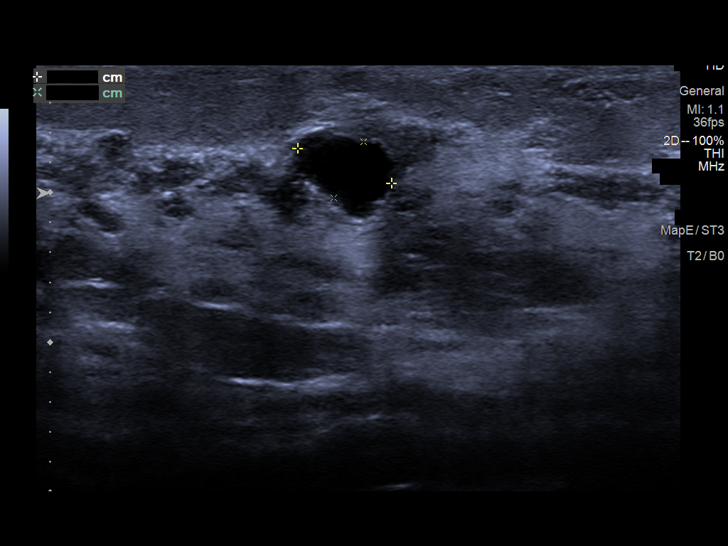
[im 8/10]
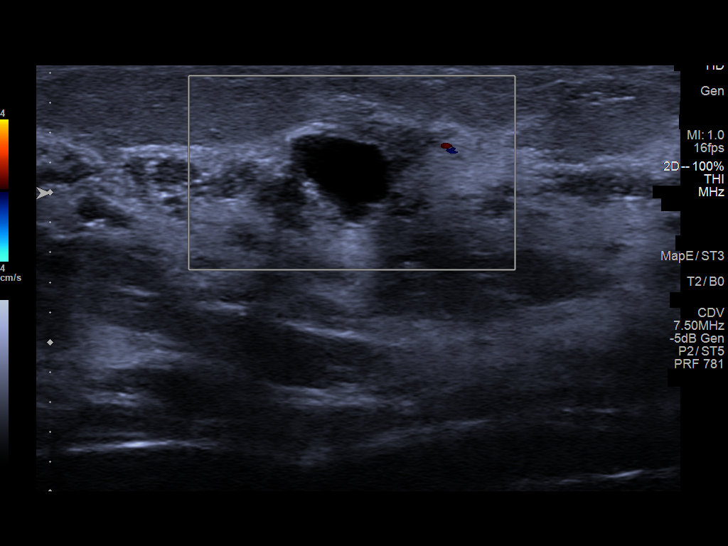
[im 9/10]
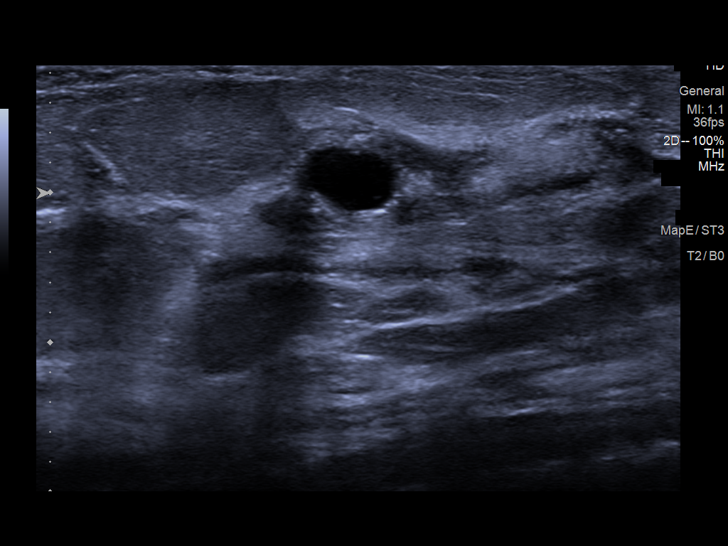
[im 10/10]
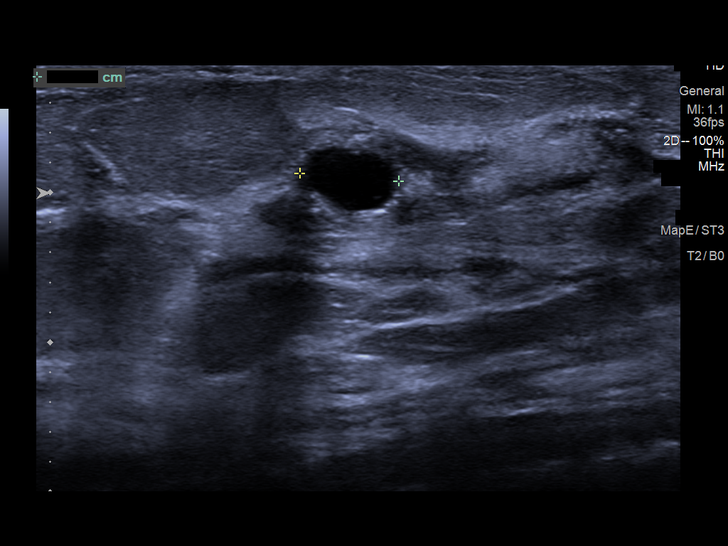

[10 of 10 positions shown; findings below may reference images not displayed]

ACR Breast Density Category c: The breast tissue is heterogeneously
dense, which may obscure small masses.
FINDINGS: RIGHT breast diagnostic mammogram: On today's additional diagnostic
views, with spot compression and 3D tomosynthesis, there is a
partially obscured mass confirmed within the posterior RIGHT breast,
measuring approximately 7 mm greatest dimension.

Targeted ultrasound is performed, showing a benign cyst in the RIGHT
breast at the 2 o'clock axis, 10 cm from the nipple, measuring 6 mm.
There is an additional benign cyst within the RIGHT breast at the 10
o'clock axis, 3 cm from the nipple, measuring 7 mm. One of these
cysts corresponds to the mammographic finding and the other cyst is
an incidental finding.
IMPRESSION: No evidence of malignancy. Benign cysts in the RIGHT breast, largest
measuring 7 mm, 1 of which corresponds to the mammographic finding.

Patient may return to routine annual bilateral screening mammogram
schedule.

RECOMMENDATION:
Screening mammogram in one year.(Code:[id number])

I have discussed the findings and recommendations with the patient.
If applicable, a reminder letter will be sent to the patient
regarding the next appointment.

BI-RADS CATEGORY  2: Benign.

## 2023-03-02 ENCOUNTER — Ambulatory Visit: Payer: Managed Care, Other (non HMO) | Admitting: Obstetrics & Gynecology
